# Patient Record
Sex: Male | Born: 1970 | Race: White | Hispanic: No | Marital: Married
Health system: Southern US, Community
[De-identification: ages and names within clinical notes are randomized; demographics above are authoritative.]

## PROBLEM LIST (undated history)

## (undated) DIAGNOSIS — K219 Gastro-esophageal reflux disease without esophagitis: Secondary | ICD-10-CM

## (undated) DIAGNOSIS — G4730 Sleep apnea, unspecified: Secondary | ICD-10-CM

## (undated) HISTORY — DX: Sleep apnea, unspecified: G47.30

## (undated) HISTORY — PX: HERNIA REPAIR: SHX51

## (undated) HISTORY — PX: APPENDECTOMY: SHX54

## (undated) HISTORY — DX: Gastro-esophageal reflux disease without esophagitis: K21.9

## (undated) HISTORY — PX: KNEE SURGERY: SHX244

---

## 2000-06-08 ENCOUNTER — Encounter: Payer: Self-pay | Admitting: Emergency Medicine

## 2000-06-08 ENCOUNTER — Emergency Department (HOSPITAL_COMMUNITY): Admission: EM | Admit: 2000-06-08 | Discharge: 2000-06-08 | Payer: Self-pay | Admitting: Emergency Medicine

## 2002-03-13 ENCOUNTER — Emergency Department (HOSPITAL_COMMUNITY): Admission: EM | Admit: 2002-03-13 | Discharge: 2002-03-13 | Payer: Self-pay | Admitting: Plastic Surgery

## 2006-08-08 ENCOUNTER — Encounter: Admission: RE | Admit: 2006-08-08 | Discharge: 2006-08-08 | Payer: Self-pay | Admitting: Occupational Medicine

## 2008-02-14 ENCOUNTER — Encounter: Admission: RE | Admit: 2008-02-14 | Discharge: 2008-02-14 | Payer: Self-pay | Admitting: Internal Medicine

## 2008-05-31 ENCOUNTER — Emergency Department (HOSPITAL_COMMUNITY): Admission: EM | Admit: 2008-05-31 | Discharge: 2008-05-31 | Payer: Self-pay | Admitting: Family Medicine

## 2020-07-16 ENCOUNTER — Encounter (HOSPITAL_COMMUNITY): Payer: Self-pay

## 2020-07-16 ENCOUNTER — Other Ambulatory Visit: Payer: Self-pay

## 2020-07-16 ENCOUNTER — Emergency Department (HOSPITAL_COMMUNITY)
Admission: EM | Admit: 2020-07-16 | Discharge: 2020-07-17 | Disposition: A | Discharge status: Home / Self Care | Payer: Managed Care, Other (non HMO) | Attending: Emergency Medicine | Admitting: Emergency Medicine

## 2020-07-16 ENCOUNTER — Emergency Department (HOSPITAL_COMMUNITY): Payer: Managed Care, Other (non HMO)

## 2020-07-16 DIAGNOSIS — N200 Calculus of kidney: Secondary | ICD-10-CM | POA: Insufficient documentation

## 2020-07-16 DIAGNOSIS — R1033 Periumbilical pain: Secondary | ICD-10-CM | POA: Diagnosis present

## 2020-07-16 HISTORY — DX: Gastro-esophageal reflux disease without esophagitis: K21.9

## 2020-07-16 HISTORY — DX: Sleep apnea, unspecified: G47.30

## 2020-07-16 LAB — CBC
HCT: 44.6 % (ref 39.0–52.0)
Hemoglobin: 15.6 g/dL (ref 13.0–17.0)
MCH: 28.2 pg (ref 26.0–34.0)
MCHC: 35 g/dL (ref 30.0–36.0)
MCV: 80.7 fL (ref 80.0–100.0)
Platelets: 219 10*3/uL (ref 150–400)
RBC: 5.53 MIL/uL (ref 4.22–5.81)
RDW: 12.7 % (ref 11.5–15.5)
WBC: 5.8 10*3/uL (ref 4.0–10.5)
nRBC: 0 % (ref 0.0–0.2)

## 2020-07-16 LAB — URINALYSIS, ROUTINE W REFLEX MICROSCOPIC
Bilirubin Urine: NEGATIVE
Glucose, UA: NEGATIVE mg/dL
Hgb urine dipstick: NEGATIVE
Ketones, ur: NEGATIVE mg/dL
Leukocytes,Ua: NEGATIVE
Nitrite: NEGATIVE
Protein, ur: NEGATIVE mg/dL
Specific Gravity, Urine: 1.019 (ref 1.005–1.030)
pH: 7 (ref 5.0–8.0)

## 2020-07-16 LAB — COMPREHENSIVE METABOLIC PANEL
ALT: 25 U/L (ref 0–44)
AST: 21 U/L (ref 15–41)
Albumin: 4.2 g/dL (ref 3.5–5.0)
Alkaline Phosphatase: 79 U/L (ref 38–126)
Anion gap: 11 (ref 5–15)
BUN: 15 mg/dL (ref 6–20)
CO2: 21 mmol/L — ABNORMAL LOW (ref 22–32)
Calcium: 10.5 mg/dL — ABNORMAL HIGH (ref 8.9–10.3)
Chloride: 105 mmol/L (ref 98–111)
Creatinine, Ser: 0.91 mg/dL (ref 0.61–1.24)
GFR, Estimated: >60 mL/min (ref >60)
Glucose, Bld: 153 mg/dL — ABNORMAL HIGH (ref 70–99)
Potassium: 4 mmol/L (ref 3.5–5.1)
Sodium: 137 mmol/L (ref 135–145)
Total Bilirubin: 0.8 mg/dL (ref 0.3–1.2)
Total Protein: 6.9 g/dL (ref 6.5–8.1)

## 2020-07-16 LAB — LIPASE, BLOOD: Lipase: 38 U/L (ref 11–51)

## 2020-07-16 MED ORDER — HYDROCODONE-ACETAMINOPHEN 5-325 MG PO TABS: 1.0000 | ORAL_TABLET | Freq: Four times a day (QID) | ORAL | 0 refills | Status: DC | PRN

## 2020-07-16 MED ORDER — ACETAMINOPHEN 325 MG PO TABS
650.0000 mg | ORAL_TABLET | Freq: Once | ORAL | Status: AC | PRN
  Administered 2020-07-16: 650 mg via ORAL
  Filled 2020-07-16: qty 2

## 2020-07-16 MED ORDER — FENTANYL CITRATE (PF) 100 MCG/2ML IJ SOLN
50.0000 ug | Freq: Once | INTRAMUSCULAR | Status: AC
  Administered 2020-07-16: 14:00:00 50 ug via INTRAVENOUS
  Filled 2020-07-16: qty 2

## 2020-07-16 MED ORDER — IOHEXOL 300 MG/ML  SOLN
100.0000 mL | Freq: Once | INTRAMUSCULAR | Status: AC | PRN
  Administered 2020-07-16: 14:00:00 100 mL via INTRAVENOUS

## 2020-07-16 MED ORDER — KETOROLAC TROMETHAMINE 30 MG/ML IJ SOLN
15.0000 mg | Freq: Once | INTRAMUSCULAR | Status: AC
  Administered 2020-07-16: 15:00:00 15 mg via INTRAVENOUS
  Filled 2020-07-16: qty 1

## 2020-07-16 MED ORDER — OXYCODONE-ACETAMINOPHEN 5-325 MG PO TABS: 1.0000 | ORAL_TABLET | Freq: Three times a day (TID) | ORAL | 0 refills | Status: AC | PRN

## 2020-07-16 NOTE — ED Triage Notes (Signed)
Pt reports LLQ pain that radiates down to his groin that started a few days ago, denies n/v/d. Denis hematuria. States he now there is pressure in his rectum.

## 2020-07-16 NOTE — ED Provider Notes (Signed)
[facility] EMERGENCY DEPARTMENT Provider Note   CSN: [id number] Arrival date & time: 07/16/20  6301     History Chief Complaint  Patient presents with   Abdominal Pain    Melvin Atkins is a 49 y.o. male.  HPI Patient presents with abdominal pain.  Started in the periumbilical area and felt as if it went down into his penis.  Then went more to the left inguinal area.  States he initially felt as if he had to urinate but then would go with how to change the pain.  Also states sometimes he would not actually urinate even though he felt he had to.  States recently is also felt as if he had to have a bowel movement but there is a pressure on there.  States going to the bathroom does not change the pain either.  No fevers or chills.  No penile discharge.  States he has had hernias previously.  States he has had repairs on both inguinal areas.  States that he did not have bulging with previous hernias.  No nausea or vomiting.  No constipation.    Past Medical History:  Diagnosis Date   GERD (gastroesophageal reflux disease)    Sleep apnea     There are no problems to display for this patient.   Past Surgical History:  Procedure Laterality Date   APPENDECTOMY     HERNIA REPAIR     KNEE SURGERY         No family history on file.     Home Medications Prior to Admission medications   Medication Sig Start Date End Date Taking? Authorizing Provider  oxyCODONE-acetaminophen (PERCOCET/ROXICET) 5-325 MG tablet Take 1-2 tablets by mouth every 8 (eight) hours as needed for severe pain. 07/16/20   Benjiman Core, MD    Allergies    Gabapentin  Review of Systems   Review of Systems  Constitutional: Negative for appetite change, fatigue and fever.  HENT: Negative for congestion.   Respiratory: Negative for shortness of breath.   Cardiovascular: Negative for chest pain.  Gastrointestinal: Positive for abdominal pain. Negative for anal bleeding, blood  in stool, constipation, nausea and vomiting.  Genitourinary: Positive for urgency.  Musculoskeletal: Negative for back pain.  Skin: Negative for rash.  Neurological: Negative for headaches.  Hematological: Negative for adenopathy.  Psychiatric/Behavioral: Negative for confusion.    Physical Exam Updated Vital Signs BP (!) 117/91 (BP Location: Left Arm)    Pulse 93    Temp 98 F (36.7 C) (Oral)    Resp 17    Ht 6' (1.829 m)    Wt 95.3 kg    SpO2 100%    BMI 28.48 kg/m   Physical Exam Vitals and nursing note reviewed.  HENT:     Head: Normocephalic and atraumatic.  Cardiovascular:     Rate and Rhythm: Normal rate.  Pulmonary:     Effort: Pulmonary effort is normal.     Breath sounds: No wheezing or rhonchi.  Abdominal:     General: Bowel sounds are normal.     Hernia: No hernia is present.     Comments: Left lower quadrant tenderness without rebound or guarding.  No hernias palpated.  Genitourinary:    Comments: No tenderness to testicles.  No hernia palpated. Skin:    General: Skin is warm.     Capillary Refill: Capillary refill takes less than 2 seconds.  Neurological:     Mental Status: He is alert and oriented to  person, place, and time.     ED Results / Procedures / Treatments   Labs (all labs ordered are listed, but only abnormal results are displayed) Labs Reviewed  COMPREHENSIVE METABOLIC PANEL - Abnormal; Notable for the following components:      Result Value   CO2 21 (*)    Glucose, Bld 153 (*)    Calcium 10.5 (*)    All other components within normal limits  LIPASE, BLOOD  CBC  URINALYSIS, ROUTINE W REFLEX MICROSCOPIC    EKG None  Radiology CT ABDOMEN PELVIS W CONTRAST  Result Date: 07/16/2020 CLINICAL DATA:  Left lower quadrant abdominal pain EXAM: CT ABDOMEN AND PELVIS WITH CONTRAST TECHNIQUE: Multidetector CT imaging of the abdomen and pelvis was performed using the standard protocol following bolus administration of intravenous contrast.  CONTRAST:  OMNIPAQUE IOHEXOL 300 MG/ML  SOLN COMPARISON:  Dec 19, 2017. FINDINGS: Lower chest: There is bibasilar atelectasis. Hepatobiliary: A small portion of the dome of the liver is not visualized. There is hepatic steatosis. No focal liver lesions evident in visualized liver. Gallbladder wall is not appreciably thickened. There is no evident biliary duct dilatation. Pancreas: No pancreatic mass or inflammatory focus. Spleen: Spleen measures 13.8 x 5.9 x 13.5 cm with a measured splenic volume of 550 mL. No focal splenic lesions are evident. Adrenals/Urinary Tract: Adrenals bilaterally appear normal. Kidneys bilaterally show no evident mass on either side. There is slight hydronephrosis on the left. No hydronephrosis on the right. There is a 2 mm nonobstructing calculus in the lower pole left kidney. There is a 2 mm calculus in the left ureterovesical junction. No other ureteral calculi are appreciable. Urinary bladder wall thickness is normal. Stomach/Bowel: There is no appreciable bowel wall or mesenteric thickening. There are occasional sigmoid diverticula without diverticulitis. The terminal ileum appears normal. No bowel obstruction. No free air or portal venous air. Vascular/Lymphatic: There is no abdominal aortic aneurysm. No arterial vascular lesions are evident. Major venous structures appear patent. There is no evident adenopathy in the abdomen or pelvis. Reproductive: Prostate and seminal vesicles are normal in size and contour. Other: Appendix absent. No periappendiceal region inflammation. No evident abscess or ascites in the abdomen or pelvis. Apparent small indirect left inguinal hernia containing fat but no bowel. Musculoskeletal: No blastic or lytic bone lesions. No intramuscular lesions evident. IMPRESSION: 1. 2 mm calculus left ureterovesical junction with slight hydronephrosis on the left. 2.  2 mm nonobstructing calculus lower pole left kidney. 3. Occasional sigmoid diverticula without  diverticulitis. No bowel wall thickening or bowel obstruction. No abscess in the abdomen or pelvis. Appendix absent. 4.  Splenic enlargement.  No focal splenic lesions evident. 5.  Hepatic steatosis. 6.  Small indirect left inguinal hernia containing fat but no bowel. Electronically Signed   By: Bretta Bang III M.D.   On: 07/16/2020 14:25    Procedures Procedures (including critical care time)  Medications Ordered in ED Medications  acetaminophen (TYLENOL) tablet 650 mg (650 mg Oral Given 07/16/20 0903)  fentaNYL (SUBLIMAZE) injection 50 mcg (50 mcg Intravenous Given 07/16/20 1337)  iohexol (OMNIPAQUE) 300 MG/ML solution 100 mL (100 mLs Intravenous Contrast Given 07/16/20 1405)  ketorolac (TORADOL) 30 MG/ML injection 15 mg (15 mg Intravenous Given 07/16/20 1515)    ED Course  I have reviewed the triage vital signs and the nursing notes.  Pertinent labs & imaging results that were available during my care of the patient were reviewed by me and considered in my medical decision making (  see chart for details).    MDM Rules/Calculators/A&P                          Patient with abdominal pain.  Groin to left lower abdomen.  Also some urinary and bowel symptoms.  Lab work and urine reassuring.  No fevers.  Patient proved after treatment. CT scan showed a small kidney stone distally.  Feels better.  Discharge home with initially prescription for Vicodin, however patient states this made him managed and that was canceled and oxycodone prescription given.  Urology follow-up as needed Final Clinical Impression(s) / ED Diagnoses Final diagnoses:  Kidney stone    Rx / DC Orders ED Discharge Orders         Ordered    HYDROcodone-acetaminophen (NORCO/VICODIN) 5-325 MG tablet  Every 6 hours PRN,   Status:  Discontinued        07/16/20 1512    oxyCODONE-acetaminophen (PERCOCET/ROXICET) 5-325 MG tablet  Every 8 hours PRN        07/16/20 1520           Benjiman Core, MD 07/16/20  1521

## 2022-05-12 IMAGING — CT CT ABD-PELV W/ CM
2 of 5 series · 16 of 46 positions shown, 18 images · IV contrast (APPLIED)
Comparison: December 19, 2017.

CLINICAL DATA: Left lower quadrant abdominal pain

EXAM:
CT ABDOMEN AND PELVIS WITH CONTRAST
TECHNIQUE: Multidetector CT imaging of the abdomen and pelvis was performed
using the standard protocol following bolus administration of
intravenous contrast.
CONTRAST:  100mL OMNIPAQUE IOHEXOL 300 MG/ML  SOLN

[Series 3: abd/ pelvis 5.0 i30f 2 · axial · 0.91mm/px · z∈[+705,+1130]mm · 13 of 95 slices shown, 15 images]
[im 5/95  soft-tissue]
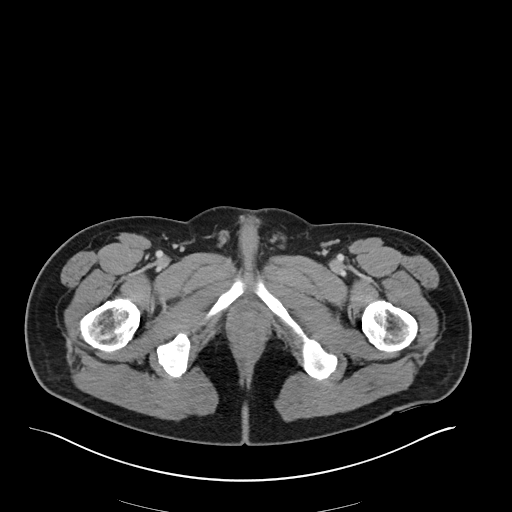
[im 5/95  bone]
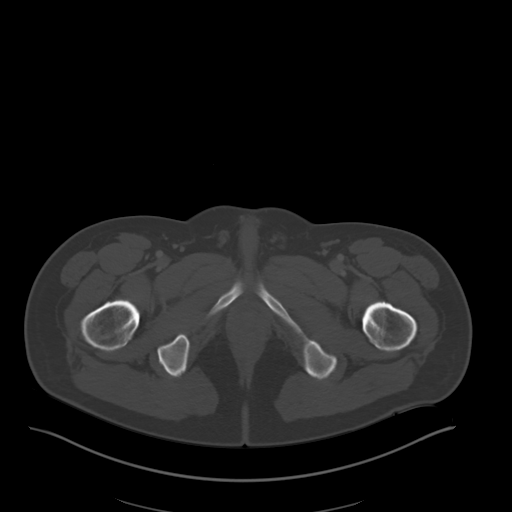
[im 15/95  soft-tissue]
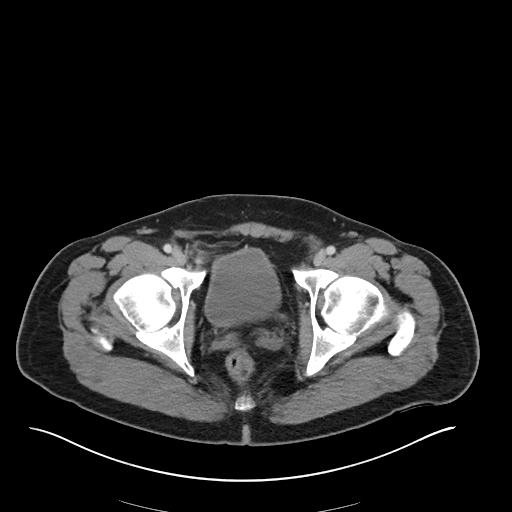
[im 19/95  soft-tissue]
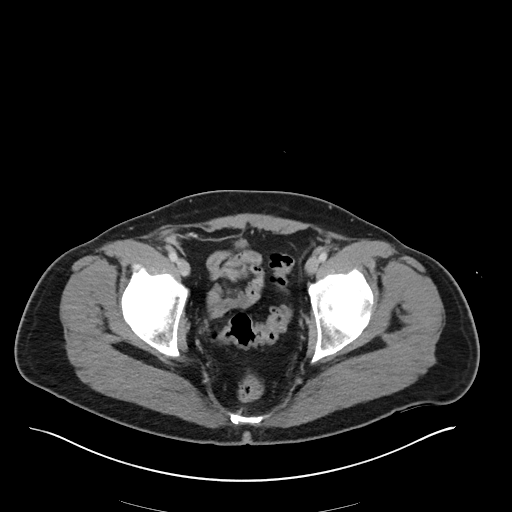
[im 29/95  soft-tissue]
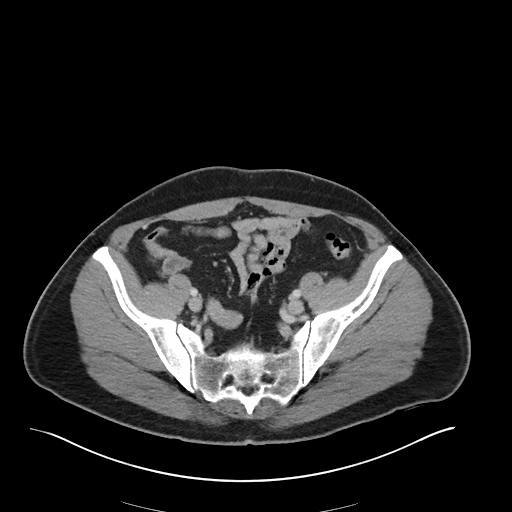
[im 33/95  soft-tissue]
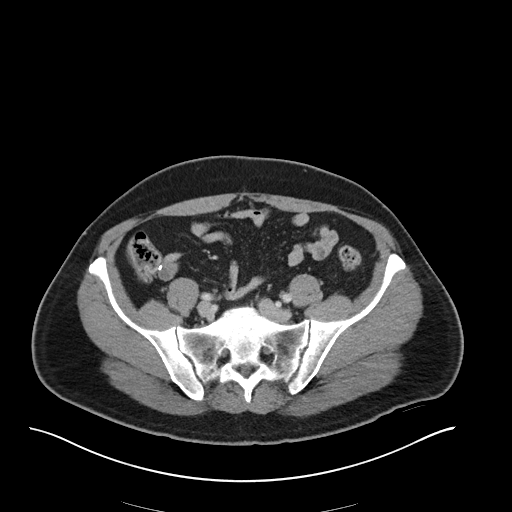
[im 43/95  soft-tissue]
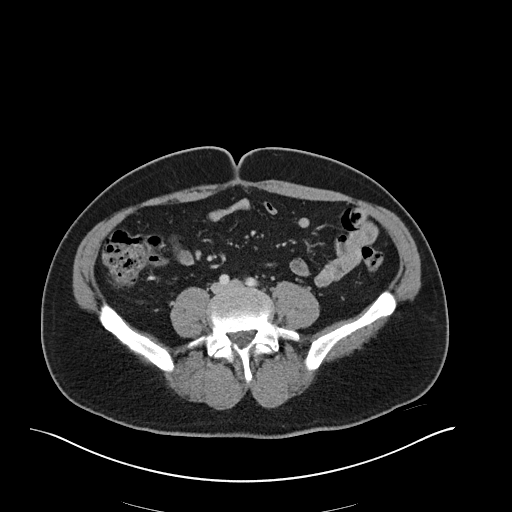
[im 48/95  soft-tissue]
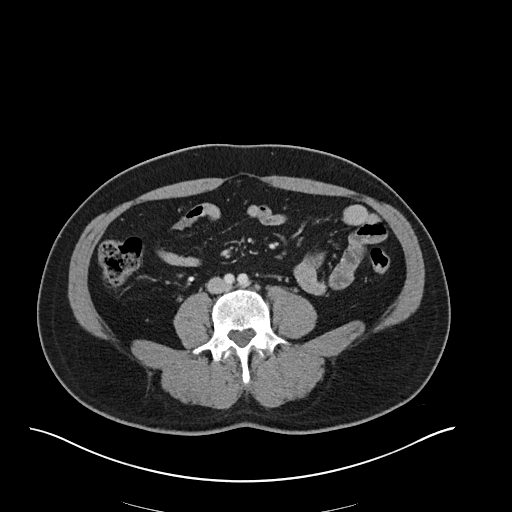
[im 52/95  soft-tissue]
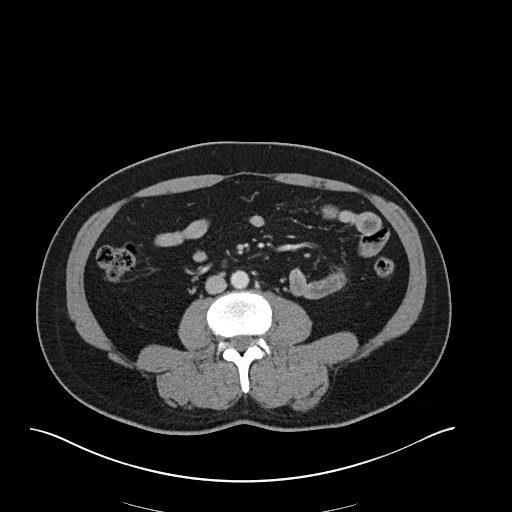
[im 62/95  soft-tissue]
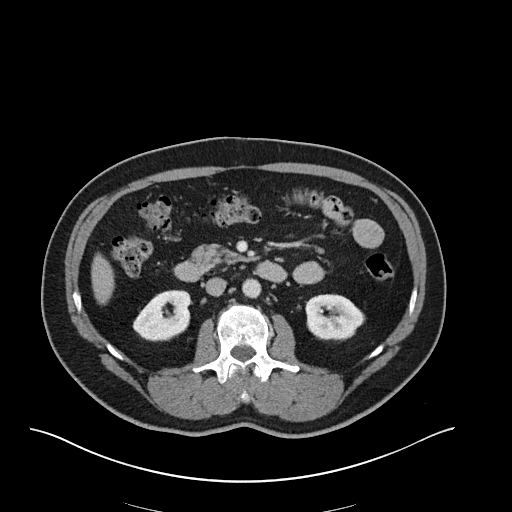
[im 62/95  bone]
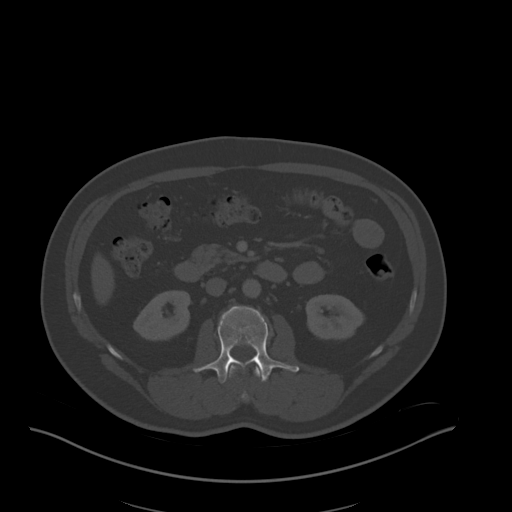
[im 66/95  soft-tissue]
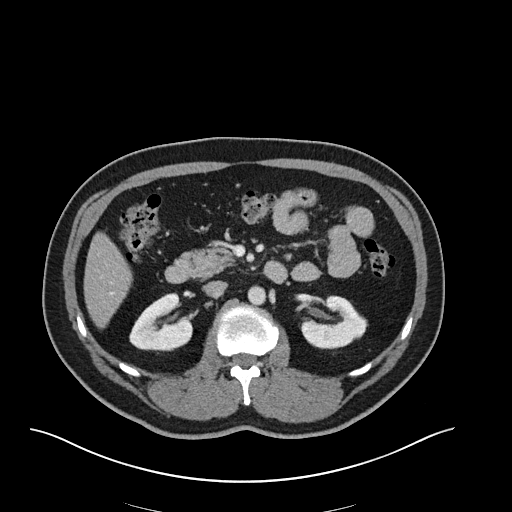
[im 76/95  soft-tissue]
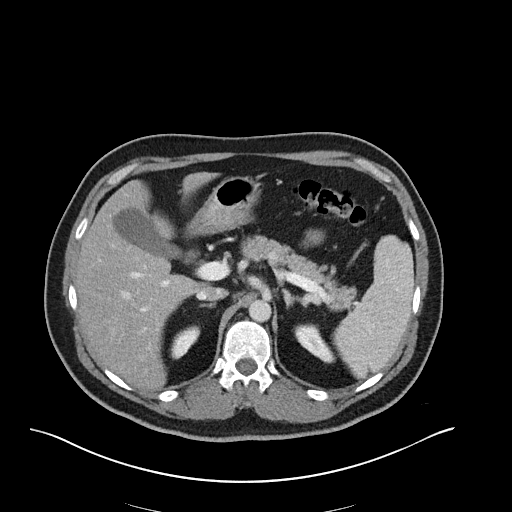
[im 80/95  soft-tissue]
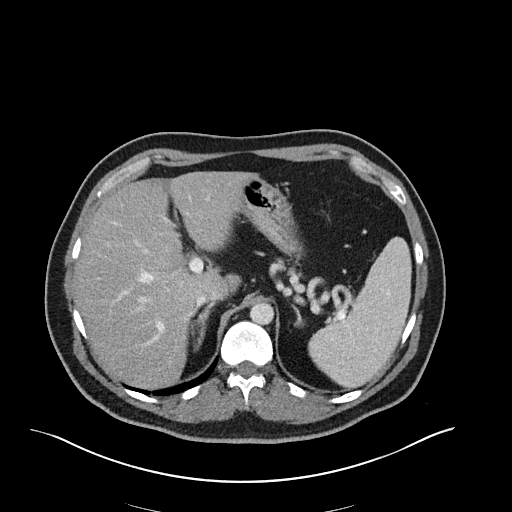
[im 90/95  soft-tissue]
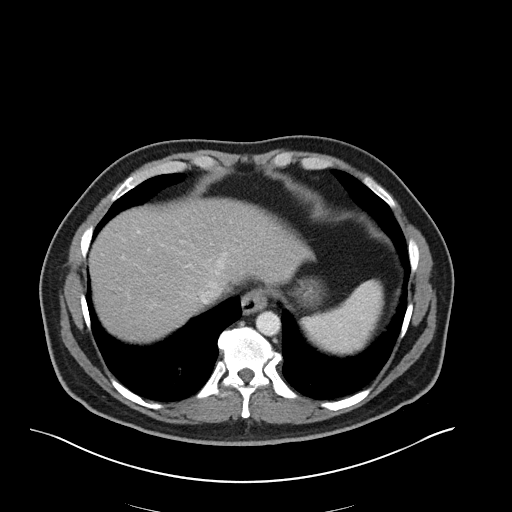

[Series 6: coronal soft tissue · coronal · 0.93mm/px · 3 of 100 slices shown]
[im 34/100  soft-tissue]
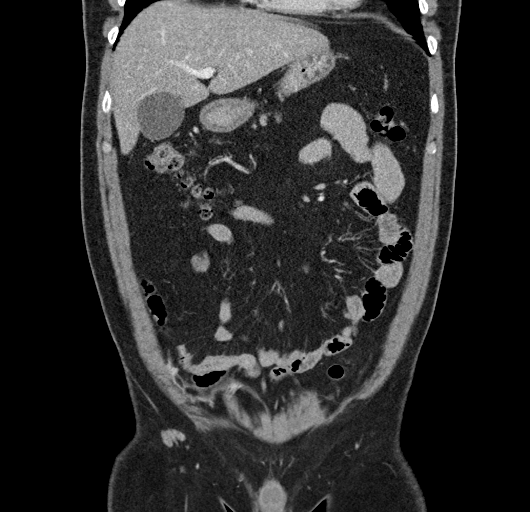
[im 45/100  soft-tissue]
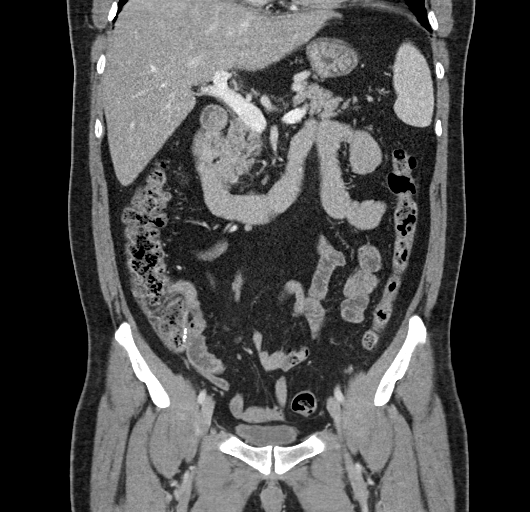
[im 56/100  soft-tissue]
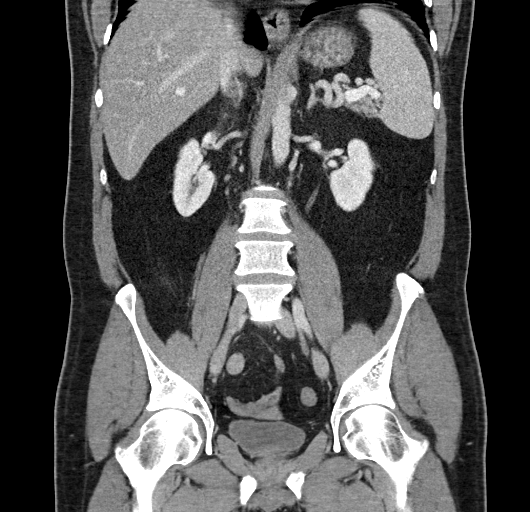

[16 of 46 positions shown; findings below may reference images not displayed]

FINDINGS: Lower chest: There is bibasilar atelectasis.

Hepatobiliary: A small portion of the dome of the liver is not
visualized. There is hepatic steatosis. No focal liver lesions
evident in visualized liver. Gallbladder wall is not appreciably
thickened. There is no evident biliary duct dilatation.

Pancreas: No pancreatic mass or inflammatory focus.

Spleen: Spleen measures 13.8 x 5.9 x 13.5 cm with a measured splenic
volume of 550 mL. No focal splenic lesions are evident.

Adrenals/Urinary Tract: Adrenals bilaterally appear normal. Kidneys
bilaterally show no evident mass on either side. There is slight
hydronephrosis on the left. No hydronephrosis on the right. There is
a 2 mm nonobstructing calculus in the lower pole left kidney. There
is a 2 mm calculus in the left ureterovesical junction. No other
ureteral calculi are appreciable. Urinary bladder wall thickness is
normal.

Stomach/Bowel: There is no appreciable bowel wall or mesenteric
thickening. There are occasional sigmoid diverticula without
diverticulitis. The terminal ileum appears normal. No bowel
obstruction. No free air or portal venous air.

Vascular/Lymphatic: There is no abdominal aortic aneurysm. No
arterial vascular lesions are evident. Major venous structures
appear patent. There is no evident adenopathy in the abdomen or
pelvis.

Reproductive: Prostate and seminal vesicles are normal in size and
contour.

Other: Appendix absent. No periappendiceal region inflammation. No
evident abscess or ascites in the abdomen or pelvis. Apparent small
indirect left inguinal hernia containing fat but no bowel.

Musculoskeletal: No blastic or lytic bone lesions. No intramuscular
lesions evident.
IMPRESSION: 1. 2 mm calculus left ureterovesical junction with slight
hydronephrosis on the left.

2.  2 mm nonobstructing calculus lower pole left kidney.

3. Occasional sigmoid diverticula without diverticulitis. No bowel
wall thickening or bowel obstruction. No abscess in the abdomen or
pelvis. Appendix absent.

4.  Splenic enlargement.  No focal splenic lesions evident.

5.  Hepatic steatosis.

6.  Small indirect left inguinal hernia containing fat but no bowel.
